# Patient Record
Sex: Male | Born: 1995 | Hispanic: Refuse to answer | Marital: Single | Smoking: Never smoker
Health system: Southern US, Community
[De-identification: ages and names within clinical notes are randomized; demographics above are authoritative.]

---

## 2016-06-08 ENCOUNTER — Ambulatory Visit (INDEPENDENT_AMBULATORY_CARE_PROVIDER_SITE_OTHER): Payer: BLUE CROSS/BLUE SHIELD | Admitting: Family Medicine

## 2016-06-08 ENCOUNTER — Encounter: Payer: Self-pay | Admitting: Family Medicine

## 2016-06-08 DIAGNOSIS — M79661 Pain in right lower leg: Secondary | ICD-10-CM

## 2016-06-08 MED ORDER — NAPROXEN 500 MG PO TABS: 500.0000 mg | ORAL_TABLET | Freq: Two times a day (BID) | ORAL | 0 refills | Status: DC

## 2016-06-08 NOTE — Progress Notes (Signed)
She presents today with symptoms of right lower leg pain. Patient states that he went hiking with some friends at the beginning of the week. He did not have any discomfort that day. The next day he did not have any discomfort either. The following day he did play golf (9 holes) and started to experience discomfort in his right shin area that went into his anterior ankle. He denied any tingling or numbness or discoloration. He denies any symptoms like this before. He also states that for about a week prior to this he was changing his technique with his swing. He is unsure whether this is related at all. He denies any history of stress injury or stress fracture. The trainer has been using game ready and stretching with him and has put him in a walking boot today. Patient states that his pain has decreased over the last 2 days since when it first began.  ROS: Negative except mentioned above.  Vitals as per Epic.  GENERAL: NAD RESP: CTA B CARD: RRR MSK: RLE- no obvious deformity, no erythema or warmth appreciated, mild generalized tenderness along medial tibial area, no strength problems with dorsiflexion and plantar flexion, no pain with walking on toes or heels, negative hop test, negative Homans, NV intact NEURO: CN II-XII grossly intact   A/P: Right medial tibial stress syndrome - at this time I do not feel that patient needs any imaging, there is no focal area of tenderness, I would recommend at this time that he use ice on the area, stretch, Naprosyn when necessary, advance activity as tolerated, if focal symptoms or worsening symptoms develop he will be put in the walking boot and will follow-up with me.

## 2017-02-11 ENCOUNTER — Ambulatory Visit (INDEPENDENT_AMBULATORY_CARE_PROVIDER_SITE_OTHER): Payer: 59 | Admitting: Family Medicine

## 2017-02-11 ENCOUNTER — Encounter: Payer: Self-pay | Admitting: Family Medicine

## 2017-02-11 DIAGNOSIS — M25551 Pain in right hip: Secondary | ICD-10-CM

## 2017-02-11 MED ORDER — NAPROXEN 500 MG PO TABS: 500.0000 mg | ORAL_TABLET | Freq: Two times a day (BID) | ORAL | 0 refills | Status: DC

## 2017-02-11 NOTE — Progress Notes (Signed)
Patient presents today with symptoms of right hip pain. Patient states that about 2 weeks ago he woke up with moderate to severe hip pain. He denies any trauma or injury to the area. He states that it was difficult to raise his leg that 1 day. He started to work with Event organiser and has done ice and stem and stretching. This seemed to help. This weekend he states that he did play some golf however nothing more than usual. This morning he woke up with a similar pain. He states it is about a 3 out of 10 right now. He does not think that sleeping on the right side causes him more pain. He denies any deep groin pain. He denies any back pain. He denies any radicular symptoms. He denies any history of hip pain in the past.   ROS: Negative except mentioned above. Vitals as per Epic. GENERAL: NAD RESP: CTA B CARD: RRR MSK: R Hip: No significant tenderness to palpation, points to the area of the greater trochanter as the area of discomfort, internal and external rotation of the right hip does reproduce pain, pain is also reproduced with resisted abduction, no lumbar spine tenderness, normal gait, negative straight leg raise, 5 out of 5 strength of lower extremities, +Obers, NV intact NEURO: CN II-XII grossly intact   A/P: Right hip pain - appears to be possibly IT band related, discussed FAI as well, will try NSAIDs, ice, rest, stretching, if symptoms do not improve recommend imaging of right hip, patient and trainer addresses understanding of plan.

## 2018-02-27 ENCOUNTER — Ambulatory Visit (INDEPENDENT_AMBULATORY_CARE_PROVIDER_SITE_OTHER): Payer: 59 | Admitting: Family Medicine

## 2018-02-27 ENCOUNTER — Encounter: Payer: Self-pay | Admitting: Family Medicine

## 2018-02-27 DIAGNOSIS — R6884 Jaw pain: Secondary | ICD-10-CM

## 2018-02-27 DIAGNOSIS — M542 Cervicalgia: Secondary | ICD-10-CM

## 2018-02-27 DIAGNOSIS — K121 Other forms of stomatitis: Secondary | ICD-10-CM

## 2018-02-27 MED ORDER — NAPROXEN 500 MG PO TABS: 500.0000 mg | ORAL_TABLET | Freq: Two times a day (BID) | ORAL | 0 refills | Status: AC
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
## 2018-02-28 ENCOUNTER — Ambulatory Visit
Admission: RE | Admit: 2018-02-28 | Discharge: 2018-02-28 | Disposition: A | Discharge status: Home / Self Care | Payer: 59 | Source: Ambulatory Visit | Attending: Family Medicine | Admitting: Family Medicine

## 2018-02-28 DIAGNOSIS — M542 Cervicalgia: Secondary | ICD-10-CM | POA: Insufficient documentation

## 2018-02-28 DIAGNOSIS — R6884 Jaw pain: Secondary | ICD-10-CM | POA: Diagnosis present
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
